# Patient Record
Sex: Female | Born: 2005
Health system: Southern US, Community
[De-identification: ages and names within clinical notes are randomized; demographics above are authoritative.]

## PROBLEM LIST (undated history)

## (undated) DIAGNOSIS — T7840XA Allergy, unspecified, initial encounter: Secondary | ICD-10-CM

## (undated) DIAGNOSIS — S53106A Unspecified dislocation of unspecified ulnohumeral joint, initial encounter: Secondary | ICD-10-CM

## (undated) HISTORY — DX: Allergy, unspecified, initial encounter: T78.40XA

## (undated) HISTORY — DX: Unspecified dislocation of unspecified ulnohumeral joint, initial encounter: S53.106A

## (undated) HISTORY — PX: NO PAST SURGERIES: SHX2092

---
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
## 2024-01-23 ENCOUNTER — Encounter: Payer: Self-pay | Admitting: Medical-Surgical

## 2024-01-23 ENCOUNTER — Ambulatory Visit (INDEPENDENT_AMBULATORY_CARE_PROVIDER_SITE_OTHER): Payer: BC Managed Care – PPO | Admitting: Medical-Surgical

## 2024-01-23 VITALS — BP 117/72 | HR 106 | Resp 20 | Ht 61.87 in | Wt 181.0 lb

## 2024-01-23 DIAGNOSIS — K219 Gastro-esophageal reflux disease without esophagitis: Secondary | ICD-10-CM

## 2024-01-23 DIAGNOSIS — F411 Generalized anxiety disorder: Secondary | ICD-10-CM

## 2024-01-23 DIAGNOSIS — Z23 Encounter for immunization: Secondary | ICD-10-CM | POA: Diagnosis not present

## 2024-01-23 DIAGNOSIS — F321 Major depressive disorder, single episode, moderate: Secondary | ICD-10-CM | POA: Diagnosis not present

## 2024-01-23 DIAGNOSIS — Z3041 Encounter for surveillance of contraceptive pills: Secondary | ICD-10-CM

## 2024-01-23 DIAGNOSIS — R1310 Dysphagia, unspecified: Secondary | ICD-10-CM

## 2024-01-23 MED ORDER — HYDROXYZINE HCL 10 MG PO TABS: 10.0000 mg | ORAL_TABLET | Freq: Three times a day (TID) | ORAL | 3 refills | Status: DC | PRN

## 2024-01-23 MED ORDER — FAMOTIDINE 20 MG PO TABS: 20.0000 mg | ORAL_TABLET | Freq: Two times a day (BID) | ORAL | 1 refills | Status: DC

## 2024-01-23 MED ORDER — ESCITALOPRAM OXALATE 20 MG PO TABS: 20.0000 mg | ORAL_TABLET | Freq: Every day | ORAL | 1 refills | Status: DC

## 2024-01-23 MED ORDER — NORGESTIMATE-ETH ESTRADIOL 0.25-35 MG-MCG PO TABS
1.0000 | ORAL_TABLET | Freq: Every day | ORAL | 3 refills | Status: AC
Start: 2024-01-23 — End: ?

## 2024-01-23 NOTE — Patient Instructions (Signed)
Reasons why it is important to allow yourself to process and experience true feelings: When you numb sadness, you also numb happiness and Kelsey Alexander. Struggling with your emotions often leads to more suffering. Processing and experiencing your feelings is a part of having a full life.    Coping skills are things we can do to make ourselves feel better when we are going through difficult times. Examples of coping skills include:  Take a deep breath Count to 20 Listen to music Call a friend Take a walk Read a book Do a puzzle Meditate Journal Exercise Stretch Sing Bake Knit Go outside Garden Pray Color Send a note Take a bath Watch a movie Pet an animal Visit a friend Be alone in a quiet place   When your anxiety gets worse, try these grounding techniques:      If you need additional help, please contact your primary care provider or call one of the resources listed below:  [facility]  24-hour HelpLine at 336-832-9700 or 800-711-2635 [address] [place], [place] [address]  National Hopeline Network 800-SUICIDE or 800-784-2433  The National Suicide Prevention Lifeline 800-273-TALK or 800-273-8255  [facility] https://www.celebraterecovery.com/   

## 2024-01-23 NOTE — Progress Notes (Signed)
        Established patient visit  Discussed the use of AI scribe software for clinical note transcription with the patient, who gave verbal consent to proceed.  History of Present Illness   Kelsey Alexander is a 18 year old female who presents for medication management and follow-up for anxiety and depression.  Mood disturbance and anxiety - Worsening anxiety and depression due to inability to obtain hydroxyzine  and Lexapro  - Considered higher dose of hydroxyzine  than currently prescribed - Experiences days with passive suicidal ideation, feeling better off dead, but denies active self-harm thoughts - Depression impairs daily functioning, causing lack of motivation and poor personal hygiene - Anxiety severely limits social interactions, causing nervousness and crying before entering public places - Social anxiety exacerbated by lack of social interaction during high school due to COVID-19 - Not currently in school, seeking employment, and spends most of her time at home, struggling with daily activities due to mood symptoms  Menstrual irregularity and dysmenorrhea - Irregular menstrual cycles while on birth control, with periods occurring earlier than expected - Recent episodes of severe menstrual cramps - Not sexually active  Dysphagia and choking sensation - Difficulty swallowing and occasional choking episodes - Concern for possible esophageal stricture due to family history (grandmother with esophageal stricture)  Gastroesophageal reflux symptoms - History of acid reflux  - Discontinued use of ibuprofen - No further episodes of hematemesis        Physical Exam  Assessment and Plan    Generalized Anxiety Disorder High anxiety levels affecting daily life, worsened after stopping medication. Lexapro  and counseling recommended. - Restart Lexapro  10 mg daily for 1 week, increase to 20 mg if tolerated. - Prescribe hydroxyzine  10 mg, three times daily as needed. - Refer to  counseling with Nathanel Collet. - Provide behavioral health resources.  Major Depressive Disorder Depressive symptoms with lack of motivation and feelings of being better off dead. Lexapro  discussed for management. No active suicidal ideation. - Restart Lexapro  10 mg daily for 1 week, increase to 20 mg if tolerated. - Refer to counseling with Kathy Gollins.  Gastroesophageal Reflux Disease (GERD) Occasional stomach pain and reflux, possibly improved after stopping ibuprofen. Famotidine  planned for management. - Prescribe famotidine , advise on OTC availability if not covered. - Advise to avoid ibuprofen, limit acetaminophen to 3000 mg/day.  Esophageal Stricture (suspected) Difficulty swallowing and occasional choking suggest possible stricture. Referral deferred due to infrequent symptoms. - Consider gastroenterology referral if symptoms worsen.  Menstrual Irregularities Irregular cycles and heavy cramping, possibly stress-related. Monitoring suggested before altering birth control. - Continue current birth control, monitor menstrual patterns.  General Health Maintenance Not sexually active, chlamydia screening not needed. Emphasized prompt health concern addressing and open communication. - Encourage open communication about health concerns. - Discuss importance of timely medication access and adherence.  Follow-up Plans to monitor response to medication and counseling. - Schedule follow-up in 4-6 weeks to assess response. - Advise to contact office for medication issues or symptom worsening.         Return in about 4 weeks (around 02/20/2024) for mood follow up.  __________________________________ Zada FREDRIK Palin, DNP, APRN, FNP-BC Primary Care and Sports Medicine [facility]

## 2024-01-29 ENCOUNTER — Encounter: Payer: Self-pay | Admitting: Medical-Surgical

## 2024-01-30 ENCOUNTER — Ambulatory Visit (INDEPENDENT_AMBULATORY_CARE_PROVIDER_SITE_OTHER): Admitting: Medical-Surgical

## 2024-01-30 ENCOUNTER — Encounter: Payer: Self-pay | Admitting: Medical-Surgical

## 2024-01-30 VITALS — BP 96/64 | HR 109 | Resp 20 | Ht 61.87 in | Wt 181.0 lb

## 2024-01-30 DIAGNOSIS — B359 Dermatophytosis, unspecified: Secondary | ICD-10-CM | POA: Diagnosis not present

## 2024-01-30 MED ORDER — CLOTRIMAZOLE-BETAMETHASONE 1-0.05 % EX CREA: 1.0000 | TOPICAL_CREAM | Freq: Two times a day (BID) | CUTANEOUS | 2 refills | Status: AC | PRN

## 2024-01-30 NOTE — Progress Notes (Signed)
        Established patient visit  Discussed the use of AI scribe software for clinical note transcription with the patient, who gave verbal consent to proceed.  History of Present Illness   Kelsey Alexander is a 18 year old female who presents with a rash suspected to be ringworm. She is accompanied by her father, who stayed in the car during the visit.  Pruritic rash - Onset one week ago, initially appeared like a bug bite - Lesions located on left forearm, back of left thigh, right thigh, and inner thighs - Rash is pruritic and sometimes burns, especially at night - Described as similar to a 'really bad sunburn' with tingling and burning sensations - No crusting or drainage present - Symptoms worsen at night, causing scratching during sleep - Thinks she may have gotten something from some kittens she played with recently  Prior treatments and response - Applied Lotrimin  cream without improvement  Contagion concerns and behavioral impact - Isolates herself to prevent spreading the rash to others, including family dogs - Expresses anxiety regarding the contagiousness of the rash - Inquires about duration of contagiousness, referencing friends who were told she was not contagious after two days of similar treatment  Symptom management inquiries - Asks about using Benadryl for relief of pruritus      Physical Exam Vitals reviewed.  Constitutional:      General: She is not in acute distress.    Appearance: Normal appearance.  HENT:     Head: Normocephalic and atraumatic.  Cardiovascular:     Rate and Rhythm: Normal rate and regular rhythm.     Pulses: Normal pulses.     Heart sounds: Normal heart sounds. No murmur heard.    No friction rub. No gallop.  Pulmonary:     Effort: Pulmonary effort is normal. No respiratory distress.     Breath sounds: Normal breath sounds. No wheezing.  Skin:    General: Skin is warm and dry.     Findings: Rash present.     Comments: Several round  lesions with pink centers and erythematous slightly raised borders. No crusting, discharge, or bleeding.   Neurological:     Mental Status: She is alert and oriented to person, place, and time.  Psychiatric:        Mood and Affect: Mood is anxious. Affect is tearful.        Behavior: Behavior is agitated.        Thought Content: Thought content normal.        Judgment: Judgment normal.     Assessment and Plan    Tinea corporis (ringworm) Itchy, burning circular lesions on left forearm, back of left thigh, and right thigh consistent with tinea corporis. Worsened over a week without improvement from OTC antifungal. Clinical presentation suggests fungal infection. Opted for topical treatment due to preference and potential systemic side effects of oral antifungals. - Prescribe Lotrisone  cream, apply twice daily for up to 14 days. - Monitor for improvement, contact clinic via MyChart if absolutely no improvement in 7 days. - Keep affected areas covered to prevent spread. - Considered non-contagious once lesions improve and no new spots appear. - Allow Benadryl for itching, advise against concurrent use of hydroxyzine .      Return if symptoms worsen or fail to improve.  __________________________________ Zada FREDRIK Palin, DNP, APRN, FNP-BC Primary Care and Sports Medicine [facility] [place]

## 2024-02-20 ENCOUNTER — Ambulatory Visit (INDEPENDENT_AMBULATORY_CARE_PROVIDER_SITE_OTHER): Admitting: Medical-Surgical

## 2024-02-20 ENCOUNTER — Encounter: Payer: Self-pay | Admitting: Medical-Surgical

## 2024-02-20 VITALS — BP 137/91 | HR 68 | Resp 20 | Ht 61.87 in | Wt 179.4 lb

## 2024-02-20 DIAGNOSIS — F411 Generalized anxiety disorder: Secondary | ICD-10-CM

## 2024-02-20 DIAGNOSIS — B359 Dermatophytosis, unspecified: Secondary | ICD-10-CM

## 2024-02-20 DIAGNOSIS — F321 Major depressive disorder, single episode, moderate: Secondary | ICD-10-CM | POA: Diagnosis not present

## 2024-02-20 MED ORDER — TERBINAFINE HCL 250 MG PO TABS: 250.0000 mg | ORAL_TABLET | Freq: Every day | ORAL | 0 refills | Status: AC

## 2024-02-20 NOTE — Progress Notes (Signed)
        Established patient visit   History of Present Illness   Discussed the use of AI scribe software for clinical note transcription with the patient, who gave verbal consent to proceed.  History of Present Illness   Kelsey Alexander is a 18 year old female who presents for follow-up on mood management and ringworm treatment.  She is taking Lexapro, restarted at 10mg  for 1 week then increased to 20mg  daily, and experiences sadness and anxiety due to situational stressors like job searching. She feels irritable and angry since the dosage increase, with episodes of forgetfulness. Hydroxyzine is used as needed for anxiety, particularly before interviews or appointments, providing relief.  She reports a recurrence of ringworm, initially improved with topical cream but reappearing with rashes resembling sunburn and blistering. The rash is itchy, bumpy, and blister-like, spreading to her arms, thighs, and sides. She uses vitamin A and D ointment and calamine lotion for relief. The rash began after contact with a friend's cat.     Physical Exam   Physical Exam Vitals reviewed.  Constitutional:      General: She is not in acute distress.    Appearance: Normal appearance.  HENT:     Head: Normocephalic and atraumatic.  Cardiovascular:     Rate and Rhythm: Normal rate and regular rhythm.     Pulses: Normal pulses.     Heart sounds: Normal heart sounds. No murmur heard.    No friction rub. No gallop.  Pulmonary:     Effort: Pulmonary effort is normal. No respiratory distress.     Breath sounds: Normal breath sounds. No wheezing.  Skin:    General: Skin is warm and dry.     Findings: Lesion (Classic ringworm circular lesions on the left forearm, left anterior thigh, and left posterior thigh) present.  Neurological:     Mental Status: She is alert and oriented to person, place, and time.  Psychiatric:        Mood and Affect: Mood normal.        Behavior: Behavior normal.        Thought  Content: Thought content normal.        Judgment: Judgment normal.     Assessment & Plan   Assessment and Plan    Depression and anxiety Ongoing depression and anxiety exacerbated by stressors. Increased irritability and anger on 20 mg Lexapro. Forgetfulness possibly related to anxiety, depression, or undiagnosed ADD. Current Lexapro dose may be too high. - Consider reducing Lexapro to 10-15 mg daily to see if it helps with anger and irritability. - Continue hydroxyzine as needed for anxiety, with the option to take 20 mg at night for itching. - Consider formal ADD testing if symptoms persist. - Schedule follow-up in 3 months to reassess mood and medication efficacy.  Recurrent tinea corporis with pruritus Recurrent tinea corporis with pruritus, initially improved with topical antifungal but recurred with additional rashes. Lesions may be spreading, indicating need for systemic treatment. - Prescribe terbinafine 250 mg orally once daily for 2 weeks. - Continue using topical antifungal cream for localized itching.     Follow up   Return in about 3 months (around 05/22/2024) for mood follow up or sooner if needed.  __________________________________ Zada FREDRIK Palin, DNP, APRN, FNP-BC Primary Care and Sports Medicine [facility] [place]

## 2024-05-22 ENCOUNTER — Ambulatory Visit: Admitting: Medical-Surgical

## 2024-06-01 ENCOUNTER — Encounter: Payer: Self-pay | Admitting: Medical-Surgical

## 2024-06-01 ENCOUNTER — Ambulatory Visit: Admitting: Medical-Surgical

## 2024-06-01 VITALS — BP 123/75 | HR 98 | Resp 20 | Ht 61.88 in | Wt 181.0 lb

## 2024-06-01 DIAGNOSIS — R55 Syncope and collapse: Secondary | ICD-10-CM | POA: Diagnosis not present

## 2024-06-01 DIAGNOSIS — F411 Generalized anxiety disorder: Secondary | ICD-10-CM

## 2024-06-01 DIAGNOSIS — Z3041 Encounter for surveillance of contraceptive pills: Secondary | ICD-10-CM

## 2024-06-01 MED ORDER — HYDROXYZINE HCL 10 MG PO TABS: 10.0000 mg | ORAL_TABLET | Freq: Three times a day (TID) | ORAL | 3 refills | Status: AC | PRN

## 2024-06-01 MED ORDER — ESCITALOPRAM OXALATE 5 MG PO TABS: ORAL_TABLET | ORAL | 0 refills | Status: AC

## 2024-06-01 NOTE — Progress Notes (Signed)
 Established patient visit   History of Present Illness   Discussed the use of AI scribe software for clinical note transcription with the patient, who gave verbal consent to proceed.  History of Present Illness   Kelsey Alexander is a 18 year old female who presents for follow-up on mood management and medication adjustment.  Mood disturbance and pharmacologic management - Feels happy today and prefers minimal medication for mood management. - Uses hydroxyzine  10mg  TID as needed. - Restarted Lexapro  in August at 10 mg, increased to 20 mg daily, currently takes half of the 20 mg dose. - Frequently forgets to take Lexapro  but feels fine overall. - No thoughts of self-harm or harm to others. - Interested in starting therapy.  Menstrual irregularity and contraceptive use - Considering discontinuing birth control, which was started for period control. - Periods remain very late despite use of birth control. - Not sexually active and has never been sexually active.  Presyncope and associated symptoms - Over the past month, experiences episodes of near-fainting with nausea. 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 - Maintains adequate hydration. - No associated stress; attributes episodes to work activities.      Physical Exam   Physical Exam Vitals reviewed.  Constitutional:      General: She is not in acute distress.    Appearance: Normal appearance.  HENT:     Head: Normocephalic and atraumatic.  Cardiovascular:     Rate and Rhythm: Normal rate and regular rhythm.     Pulses: Normal pulses.     Heart sounds: Normal heart sounds. No murmur heard.    No friction rub. No gallop.  Pulmonary:     Effort: Pulmonary effort is normal. No respiratory distress.     Breath sounds: Normal breath sounds. No wheezing.  Skin:    General: Skin is warm and dry.  Neurological:     Mental Status: She is alert and oriented to person,  place, and time.  Psychiatric:        Mood and Affect: Mood normal.        Behavior: Behavior normal.        Thought Content: Thought content normal.        Judgment: Judgment normal.    Assessment & Plan     Assessment and Plan    Generalized anxiety disorder She reported feeling well-managed with minimal hydroxyzine  use. Inconsistent Lexapro  use noted. Interested in tapering off Lexapro  and focusing on therapy. No self-harm thoughts. Discussed risk of symptom return if Lexapro  is stopped abruptly. - Prescribed Lexapro  5 mg daily for 10 days, then every other day for 10 days, then twice a week for 10 days, then stop. - Refilled hydroxyzine  prescription. - Encouraged initiation of therapy sessions.  Syncope Episodes of faintness with nausea, lightheadedness, and dizziness. Possible vasovagal syncope related to activity and position changes. No significant stressors identified. - Provided information on vasovagal syncope and management strategies. - Advised monitoring symptoms and potential triggers, including hydration, medication intake, and dietary habits. - Recommended slow movements and avoiding rapid position changes.     Surveillance for birth control, oral contraceptives COCs started initially for menstrual management but cycle still running late. Considering stopping the oral contraceptives. - Discussed temporarily stopping the pill for 1-3 months to allow for reset of her cycle vs stopping completely.  - Advised that her prior symptoms are likely to recur if stopping the pill.  -  Recommend using condoms if she becomes sexually active with a female partner.   Follow up   Return in about 3 months (around 09/01/2024) for mood follow up. __________________________________ Zada FREDRIK Palin, DNP, APRN, FNP-BC Primary Care and Sports Medicine [facility] [place]

## 2024-09-01 ENCOUNTER — Ambulatory Visit: Admitting: Medical-Surgical
